# Patient Record
Sex: Female | Born: 2000 | Race: White | Hispanic: No | Marital: Single | Smoking: Never smoker
Health system: Southern US, Community
[De-identification: ages and names within clinical notes are randomized; demographics above are authoritative.]

## PROBLEM LIST (undated history)

## (undated) DIAGNOSIS — J45909 Unspecified asthma, uncomplicated: Secondary | ICD-10-CM

---
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
## 2018-06-27 ENCOUNTER — Ambulatory Visit (HOSPITAL_COMMUNITY)
Admission: EM | Admit: 2018-06-27 | Discharge: 2018-06-27 | Disposition: A | Discharge status: Home / Self Care | Payer: Self-pay | Attending: Family Medicine | Admitting: Family Medicine

## 2018-06-27 ENCOUNTER — Encounter (HOSPITAL_COMMUNITY): Payer: Self-pay | Admitting: Emergency Medicine

## 2018-06-27 DIAGNOSIS — J029 Acute pharyngitis, unspecified: Secondary | ICD-10-CM | POA: Insufficient documentation

## 2018-06-27 DIAGNOSIS — J4520 Mild intermittent asthma, uncomplicated: Secondary | ICD-10-CM | POA: Insufficient documentation

## 2018-06-27 DIAGNOSIS — Z113 Encounter for screening for infections with a predominantly sexual mode of transmission: Secondary | ICD-10-CM | POA: Insufficient documentation

## 2018-06-27 LAB — POCT RAPID STREP A: Streptococcus, Group A Screen (Direct): NEGATIVE

## 2018-06-27 MED ORDER — DOXYCYCLINE HYCLATE 100 MG PO CAPS: 100.0000 mg | ORAL_CAPSULE | Freq: Two times a day (BID) | ORAL | 0 refills | Status: DC

## 2018-06-27 MED ORDER — ALBUTEROL SULFATE HFA 108 (90 BASE) MCG/ACT IN AERS
2.0000 | INHALATION_SPRAY | Freq: Once | RESPIRATORY_TRACT | Status: AC
  Administered 2018-06-27: 2 via RESPIRATORY_TRACT

## 2018-06-27 MED ORDER — CETIRIZINE-PSEUDOEPHEDRINE ER 5-120 MG PO TB12: 1.0000 | ORAL_TABLET | Freq: Every day | ORAL | 0 refills | Status: DC

## 2018-06-27 MED ORDER — ALBUTEROL SULFATE HFA 108 (90 BASE) MCG/ACT IN AERS
INHALATION_SPRAY | RESPIRATORY_TRACT | Status: AC
  Filled 2018-06-27: qty 6.7

## 2018-06-27 MED ORDER — FLUTICASONE PROPIONATE 50 MCG/ACT NA SUSP: 2.0000 | Freq: Every day | NASAL | 0 refills | Status: DC

## 2018-06-27 MED ORDER — ALBUTEROL SULFATE (2.5 MG/3ML) 0.083% IN NEBU: 2.5000 mg | INHALATION_SOLUTION | Freq: Four times a day (QID) | RESPIRATORY_TRACT | 1 refills | Status: DC | PRN

## 2018-06-27 MED ORDER — CEFTRIAXONE SODIUM 250 MG IJ SOLR
250.0000 mg | Freq: Once | INTRAMUSCULAR | Status: AC
  Administered 2018-06-27: 250 mg via INTRAMUSCULAR

## 2018-06-27 MED ORDER — CEFTRIAXONE SODIUM 250 MG IJ SOLR
INTRAMUSCULAR | Status: AC
  Filled 2018-06-27: qty 250

## 2018-06-27 NOTE — ED Provider Notes (Signed)
[facility]   [id number] 06/27/18 Arrival Time: 1135   CC: URI symptoms and STD check  SUBJECTIVE: History from: patient.  Joann Dickerson is a 18 y.o. female who presents with sore throat and left ear pain x 2 days.  Denies known sick exposure or precipitating event, but works in a daycare.  Has tried OTC medications without relief.  Symptoms are made worse with swallowing, but tolerating liquids and secretions without difficulty.  Reports previous symptoms in the past and diagnosed with strep.   Complains of low grade fever.  Denies chills, fatigue, sinus pain, rhinorrhea, SOB, wheezing, chest pain, nausea, changes in bowel or bladder habits.    Pt also request STI testing today.  Currently asymptomatic.  Last unprotected sex 4-5 days ago.  Denies previous hx.  Denies abdominal or pelvic pain, rashes, lesions or bumps, vaginal discharge, vaginal itching, vaginal bleeding, dyspareunia.    Also request inhaler as well as nebulizer treatment for asthma.    ROS: As per HPI.  Past Medical History:  Diagnosis Date  . Asthma    History reviewed. No pertinent surgical history. Allergies  Allergen Reactions  . Zithromax [Azithromycin]     Rash, facial swelling   No current facility-administered medications on file prior to encounter.    No current outpatient medications on file prior to encounter.   Social History   Socioeconomic History  . Marital status: Single    Spouse name: Not on file  . Number of children: Not on file  . Years of education: Not on file  . Highest education level: Not on file  Occupational History  . Not on file  Social Needs  . Financial resource strain: Not on file  . Food insecurity:    Worry: Not on file    Inability: Not on file  . Transportation needs:    Medical: Not on file    Non-medical: Not on file  Tobacco Use  . Smoking status: Never Smoker  Substance and Sexual Activity  . Alcohol use: No    Frequency: Never  . Drug  use: Not on file  . Sexual activity: Not on file  Lifestyle  . Physical activity:    Days per week: Not on file    Minutes per session: Not on file  . Stress: Not on file  Relationships  . Social connections:    Talks on phone: Not on file    Gets together: Not on file    Attends religious service: Not on file    Active member of club or organization: Not on file    Attends meetings of clubs or organizations: Not on file    Relationship status: Not on file  . Intimate partner violence:    Fear of current or ex partner: Not on file    Emotionally abused: Not on file    Physically abused: Not on file    Forced sexual activity: Not on file  Other Topics Concern  . Not on file  Social History Narrative  . Not on file   No family history on file.  OBJECTIVE:  Vitals:   06/27/18 1303  BP: 133/70  Pulse: 77  Resp: 18  Temp: 99.1 F (37.3 C)  SpO2: 100%     General appearance: alert; appears milldy fatigued, but nontoxic; speaking in full sentences and tolerating own secretions HEENT: NCAT; Ears: EACs clear, TMs pearly gray; Eyes: PERRL.  EOM grossly intact. Nose: nares patent with rhinorrhea, Throat: oropharynx clear, tonsils  mildly erythematous not enlarged, uvula midline  Neck: supple without LAD Lungs: unlabored respirations, symmetrical air entry; cough: absent; no respiratory distress; CTAB Heart: regular rate and rhythm.  Radial pulses 2+ symmetrical bilaterally Abdomen: soft, nondistended, normal active bowel sounds; nontender to palpation; no guarding  GU: deferred Skin: warm and dry Psychological: alert and cooperative; normal mood and affect  LABS:  Results for orders placed or performed during the hospital encounter of 06/27/18 (from the past 24 hour(s))  POCT rapid strep A [facility])     Status: None   Collection Time: 06/27/18  1:34 PM  Result Value Ref Range   Streptococcus, Group A Screen (Direct) NEGATIVE NEGATIVE     ASSESSMENT & PLAN:  1.  Sore throat   2. Screen for sexually transmitted diseases   3. Mild intermittent asthma without complication     Meds ordered this encounter  Medications  . cefTRIAXone (ROCEPHIN) injection 250 mg    Order Specific Question:   Antibiotic Indication:    Answer:   STD  . doxycycline (VIBRAMYCIN) 100 MG capsule    Sig: Take 1 capsule (100 mg total) by mouth 2 (two) times daily.    Dispense:  20 capsule    Refill:  0    Order Specific Question:   Supervising Provider    Answer:   Eustace Moore [[id number]]  . albuterol (PROVENTIL HFA;VENTOLIN HFA) 108 (90 Base) MCG/ACT inhaler 2 puff  . albuterol (PROVENTIL) (2.5 MG/3ML) 0.083% nebulizer solution    Sig: Take 3 mLs (2.5 mg total) by nebulization every 6 (six) hours as needed for wheezing or shortness of breath.    Dispense:  75 mL    Refill:  1    Order Specific Question:   Supervising Provider    Answer:   Eustace Moore [[id number]]  . cetirizine-pseudoephedrine (ZYRTEC-D) 5-120 MG tablet    Sig: Take 1 tablet by mouth daily.    Dispense:  30 tablet    Refill:  0    Order Specific Question:   Supervising Provider    Answer:   Eustace Moore [[id number]]  . fluticasone (FLONASE) 50 MCG/ACT nasal spray    Sig: Place 2 sprays into both nostrils daily.    Dispense:  16 g    Refill:  0    Order Specific Question:   Supervising Provider    Answer:   Eustace Moore [[id number]]    Strep negative We will send out to culture and notify you of abnormal results. Symptoms most likely viral.  Zyrtec D and flonase prescribed.  Use daily for symptomatic relief Use OTC ibuprofen and/or tylenol as needed for pain and/ or fever  Vaginal swab and blood work obtained.   Ceftriaxone injection given in office.  Doxycycline, vs. azithromycin prescribed due to azithromycin allergy.  Take medications as directed and to completion We will follow up with you regarding abnormal results.   Please refrain from sexual activity in the meantime.  If  results are positive, please notify partners and encourage them to get treated before resuming sexual activity  Inhaler given in office.  Use as needed for shortness of breath and/or wheezing Nebulizer solution refilled.  Use as directed.    Return or go to ER if you have any new or worsening symptoms fever, chills, nausea, vomiting, chest pain, cough, shortness of breath, wheezing, abdominal pain, changes in bowel or bladder habits, etc...  Reviewed expectations re: course of current medical issues. Questions answered. Outlined  signs and symptoms indicating need for more acute intervention. Patient verbalized understanding. After Visit Summary given.         Rennis HardingWurst, Aleigha Gilani, PA-C 06/27/18 1417

## 2018-06-27 NOTE — Discharge Instructions (Addendum)
Strep negative We will send out to culture and notify you of abnormal results. Symptoms most likely viral.  Zyrtec D and flonase prescribed.  Use daily for symptomatic relief Use OTC ibuprofen and/or tylenol as needed for pain and/ or fever  Vaginal swab and blood work obtained.   Ceftriaxone injection given in office.  Doxycycline, vs. azithromycin prescribed due to azithromycin allergy.  Take medications as directed and to completion We will follow up with you regarding abnormal results.   Please refrain from sexual activity in the meantime.  If results are positive, please notify partners and encourage them to get treated before resuming sexual activity  Inhaler given in office.  Use as needed for shortness of breath and/or wheezing Nebulizer solution refilled.  Use as directed.    Follow up with PCP or [facility] if symptoms persists Return or go to ER if you have any new or worsening symptoms fever, chills, nausea, vomiting, chest pain, cough, shortness of breath, wheezing, abdominal pain, changes in bowel or bladder habits, etc..Marland Kitchen

## 2018-06-27 NOTE — ED Triage Notes (Signed)
Pt c/o sore throat and L ear pain x2 days, would like std check as well no symptoms.

## 2018-06-28 LAB — CERVICOVAGINAL ANCILLARY ONLY
Bacterial vaginitis: POSITIVE — AB
Candida vaginitis: NEGATIVE
Chlamydia: NEGATIVE
Neisseria Gonorrhea: NEGATIVE
TRICH (WINDOWPATH): NEGATIVE

## 2018-06-29 ENCOUNTER — Telehealth (HOSPITAL_COMMUNITY): Payer: Self-pay | Admitting: Emergency Medicine

## 2018-06-29 MED ORDER — METRONIDAZOLE 500 MG PO TABS: 500.0000 mg | ORAL_TABLET | Freq: Two times a day (BID) | ORAL | 0 refills | Status: AC

## 2018-06-29 NOTE — Telephone Encounter (Signed)
NEGATIVE STDs. STOP THE DOXYCYCLINE.  Bacterial vaginosis is positive. This was not treated at the urgent care visit.  Flagyl 500 mg BID x 7 days #14 no refills sent to patients pharmacy of choice.    Attempted to reach patient. No answer at this time. No voicemail.

## 2018-06-30 LAB — CULTURE, GROUP A STREP (THRC)

## 2018-07-02 ENCOUNTER — Telehealth (HOSPITAL_COMMUNITY): Payer: Self-pay | Admitting: Emergency Medicine

## 2018-07-02 NOTE — Telephone Encounter (Signed)
Attempted call x 2 no answer and no voicemail available

## 2018-07-05 ENCOUNTER — Telehealth (HOSPITAL_COMMUNITY): Payer: Self-pay | Admitting: Emergency Medicine

## 2018-07-05 NOTE — Telephone Encounter (Signed)
Attempted to reach patient x3. Someone answered and hung up immediately.

## 2018-07-25 ENCOUNTER — Encounter (HOSPITAL_COMMUNITY): Payer: Self-pay | Admitting: Emergency Medicine

## 2018-07-25 ENCOUNTER — Other Ambulatory Visit: Payer: Self-pay

## 2018-07-25 ENCOUNTER — Ambulatory Visit (HOSPITAL_COMMUNITY)
Admission: EM | Admit: 2018-07-25 | Discharge: 2018-07-25 | Disposition: A | Discharge status: Home / Self Care | Payer: Self-pay | Attending: Family Medicine | Admitting: Family Medicine

## 2018-07-25 DIAGNOSIS — J029 Acute pharyngitis, unspecified: Secondary | ICD-10-CM | POA: Insufficient documentation

## 2018-07-25 DIAGNOSIS — J4541 Moderate persistent asthma with (acute) exacerbation: Secondary | ICD-10-CM | POA: Insufficient documentation

## 2018-07-25 LAB — POCT RAPID STREP A: Streptococcus, Group A Screen (Direct): NEGATIVE

## 2018-07-25 MED ORDER — PREDNISONE 10 MG (21) PO TBPK: ORAL_TABLET | Freq: Every day | ORAL | 0 refills | Status: DC

## 2018-07-25 MED ORDER — ALBUTEROL SULFATE (2.5 MG/3ML) 0.083% IN NEBU: 2.5000 mg | INHALATION_SOLUTION | Freq: Four times a day (QID) | RESPIRATORY_TRACT | 1 refills | Status: DC | PRN

## 2018-07-25 NOTE — ED Triage Notes (Signed)
Pt c/o asthma flair up, tried her neb and inhaler without relief, pt states she had a fever today as well. Temp 101.8. took ibuprofen.

## 2018-07-25 NOTE — ED Provider Notes (Signed)
[facility]   [id number] 07/25/18 Arrival Time: 1326  ASSESSMENT & PLAN:  1. Moderate persistent asthma with exacerbation   2. Sore throat    Labs Reviewed  CULTURE, GROUP A STREP [facility])  POCT RAPID STREP A   Rapid strep negative. Culture sent. See AVS for d/c instructions.  Meds ordered this encounter  Medications  . albuterol (PROVENTIL) (2.5 MG/3ML) 0.083% nebulizer solution    Sig: Take 3 mLs (2.5 mg total) by nebulization every 6 (six) hours as needed for wheezing or shortness of breath.    Dispense:  75 mL    Refill:  1  . predniSONE (STERAPRED UNI-PAK 21 TAB) 10 MG (21) TBPK tablet    Sig: Take by mouth daily. Take as directed.    Dispense:  21 tablet    Refill:  0   Asthma precautions given. OTC symptom care as needed.  Reviewed expectations re: course of current medical issues. Questions answered. Outlined signs and symptoms indicating need for more acute intervention. Patient verbalized understanding. After Visit Summary given.  SUBJECTIVE: History from: patient.  Joann Dickerson is a 18 y.o. female who presents with complaint of persistent chest tightness and wheezing. Triggers: seasonal allergies. Onset gradually, over the past week. Describes wheezing as moderate when present. With a cough. Both cough and wheezing worse at night. Albuterol neb and inhaler with temporary relief only. Mild headache over the past few days. Fever: yes, reported at 101.8 degrees F at home today. Advil helped. Overall normal PO intake without n/v. Sick contacts: no. Typically her asthma is well controlled. OTC treatment: none reported. No current chest pain.  Social History   Tobacco Use  Smoking Status Never Smoker   ROS: As per HPI. All other systems negative.    OBJECTIVE:  Vitals:   07/25/18 1339 07/25/18 1342  BP: 98/60   Pulse: 88   Resp: 16   Temp: 98.4 F (36.9 C)   SpO2:  100%    General appearance: alert; appears fatigued HEENT: nasal  congestion; clear runny nose; throat irritation and moderate erythema; slightly enlarged tonsils bilaterally Neck: supple without LAD Cv: RRR without murmer Lungs: unlabored respirations, mild bilateral expiratory wheezing; cough: mild; no significant respiratory distress Skin: warm and dry Psychological: alert and cooperative; normal mood and affect   Allergies  Allergen Reactions  . Zithromax [Azithromycin]     Rash, facial swelling    Past Medical History:  Diagnosis Date  . Asthma     Social History   Socioeconomic History  . Marital status: Single    Spouse name: Not on file  . Number of children: Not on file  . Years of education: Not on file  . Highest education level: Not on file  Occupational History  . Not on file  Social Needs  . Financial resource strain: Not on file  . Food insecurity:    Worry: Not on file    Inability: Not on file  . Transportation needs:    Medical: Not on file    Non-medical: Not on file  Tobacco Use  . Smoking status: Never Smoker  Substance and Sexual Activity  . Alcohol use: No    Frequency: Never  . Drug use: Not on file  . Sexual activity: Not on file  Lifestyle  . Physical activity:    Days per week: Not on file    Minutes per session: Not on file  . Stress: Not on file  Relationships  . Social connections:  Talks on phone: Not on file    Gets together: Not on file    Attends religious service: Not on file    Active member of club or organization: Not on file    Attends meetings of clubs or organizations: Not on file    Relationship status: Not on file  . Intimate partner violence:    Fear of current or ex partner: Not on file    Emotionally abused: Not on file    Physically abused: Not on file    Forced sexual activity: Not on file  Other Topics Concern  . Not on file  Social History Narrative  . Not on file            Mardella Layman, MD 07/26/18 [phone]

## 2018-07-25 NOTE — Discharge Instructions (Addendum)
You may use over the counter ibuprofen or acetaminophen as needed.  For a sore throat, over the counter products such as [employer] Peroxyl Mouth Sore Rinse or Chloraseptic Sore Throat Spray may provide some temporary relief. Your rapid strep test was negative today. We have sent your throat swab for culture and will let you know of any positive results. 

## 2018-07-28 LAB — CULTURE, GROUP A STREP (THRC)

## 2018-10-14 ENCOUNTER — Encounter (HOSPITAL_COMMUNITY): Payer: Self-pay

## 2018-10-14 ENCOUNTER — Other Ambulatory Visit: Payer: Self-pay

## 2018-10-14 ENCOUNTER — Ambulatory Visit (HOSPITAL_COMMUNITY)
Admission: EM | Admit: 2018-10-14 | Discharge: 2018-10-14 | Disposition: A | Discharge status: Home / Self Care | Payer: Self-pay | Attending: Family Medicine | Admitting: Family Medicine

## 2018-10-14 DIAGNOSIS — R05 Cough: Secondary | ICD-10-CM

## 2018-10-14 DIAGNOSIS — J0100 Acute maxillary sinusitis, unspecified: Secondary | ICD-10-CM

## 2018-10-14 DIAGNOSIS — R059 Cough, unspecified: Secondary | ICD-10-CM

## 2018-10-14 MED ORDER — AMOXICILLIN-POT CLAVULANATE 875-125 MG PO TABS: 1.0000 | ORAL_TABLET | Freq: Two times a day (BID) | ORAL | 0 refills | Status: AC

## 2018-10-14 MED ORDER — FLUTICASONE PROPIONATE 50 MCG/ACT NA SUSP: 2.0000 | Freq: Every day | NASAL | 0 refills | Status: AC

## 2018-10-14 MED ORDER — CETIRIZINE-PSEUDOEPHEDRINE ER 5-120 MG PO TB12: 1.0000 | ORAL_TABLET | Freq: Every day | ORAL | 0 refills | Status: AC

## 2018-10-14 MED ORDER — BENZONATATE 100 MG PO CAPS: 100.0000 mg | ORAL_CAPSULE | Freq: Three times a day (TID) | ORAL | 0 refills | Status: AC

## 2018-10-14 MED ORDER — ALBUTEROL SULFATE (2.5 MG/3ML) 0.083% IN NEBU: 2.5000 mg | INHALATION_SOLUTION | Freq: Four times a day (QID) | RESPIRATORY_TRACT | 1 refills | Status: DC | PRN

## 2018-10-14 NOTE — ED Provider Notes (Signed)
[facility]    CSN: [id number] Arrival date & time: 10/14/18  1100     History   Chief Complaint Chief Complaint  Patient presents with  . Cough    HPI Joann Dickerson is a 18 y.o. female.   Patient is a 18 year old female the presents today with approximately 8 days of cough, congestion, sinus congestion, mucus, fever and sinus pressure.  Symptoms have worsened.  Reporting fever as high as 101.  She has been taking over-the-counter medication for relief of symptoms.  Patient does work in a daycare.  She was tested for COVID on Thursday which revealed negative results.  She also has asthma and has been using her albuterol nebulizer machine due to mild shortness of breath.  This seems to help.  No known sick contacts that she is aware of.  No chest pain, shortness of breath.  ROS per HPI      Past Medical History:  Diagnosis Date  . Asthma     There are no active problems to display for this patient.   History reviewed. No pertinent surgical history.  OB History   No obstetric history on file.      Home Medications    Prior to Admission medications   Medication Sig Start Date End Date Taking? Authorizing Provider  albuterol (PROVENTIL) (2.5 MG/3ML) 0.083% nebulizer solution Take 3 mLs (2.5 mg total) by nebulization every 6 (six) hours as needed for wheezing or shortness of breath. 10/14/18   Dahlia ByesBast, Devlon Dosher A, NP  amoxicillin-clavulanate (AUGMENTIN) 875-125 MG tablet Take 1 tablet by mouth every 12 (twelve) hours. 10/14/18   Deriyah Kunath, Gloris Manchesterraci A, NP  benzonatate (TESSALON) 100 MG capsule Take 1 capsule (100 mg total) by mouth every 8 (eight) hours. 10/14/18   Aveion Nguyen, Gloris Manchesterraci A, NP  cetirizine-pseudoephedrine (ZYRTEC-D) 5-120 MG tablet Take 1 tablet by mouth daily. 10/14/18   Roniyah Llorens, Gloris Manchesterraci A, NP  fluticasone (FLONASE) 50 MCG/ACT nasal spray Place 2 sprays into both nostrils daily. 10/14/18   Janace ArisBast, Tora Prunty A, NP    Family History History reviewed. No pertinent family history.   Social History Social History   Tobacco Use  . Smoking status: Never Smoker  . Smokeless tobacco: Never Used  Substance Use Topics  . Alcohol use: No    Frequency: Never  . Drug use: Not on file     Allergies   Zithromax [azithromycin]   Review of Systems Review of Systems   Physical Exam Triage Vital Signs ED Triage Vitals  Enc Vitals Group     BP 10/14/18 1115 108/65     Pulse Rate 10/14/18 1115 92     Resp 10/14/18 1115 18     Temp 10/14/18 1115 98.7 F (37.1 C)     Temp Source 10/14/18 1115 Oral     SpO2 10/14/18 1115 100 %     Weight 10/14/18 1113 170 lb (77.1 kg)     Height --      Head Circumference --      Peak Flow --      Pain Score 10/14/18 1113 7     Pain Loc --      Pain Edu? --      Excl. in GC? --    No data found.  Updated Vital Signs BP 108/65 (BP Location: Right Arm)   Pulse 92   Temp 98.7 F (37.1 C) (Oral)   Resp 18   Wt 170 lb (77.1 kg)   LMP 10/14/2018   SpO2  100%   Visual Acuity Right Eye Distance:   Left Eye Distance:   Bilateral Distance:    Right Eye Near:   Left Eye Near:    Bilateral Near:     Physical Exam Vitals signs and nursing note reviewed.  Constitutional:      General: She is not in acute distress.    Appearance: Normal appearance. She is not ill-appearing, toxic-appearing or diaphoretic.  HENT:     Head: Normocephalic and atraumatic.     Right Ear: Tympanic membrane and ear canal normal.     Left Ear: Hearing normal. A middle ear effusion is present.     Nose: Congestion and rhinorrhea present.     Right Turbinates: Not swollen.     Left Turbinates: Not swollen.     Right Sinus: Maxillary sinus tenderness present.     Left Sinus: Maxillary sinus tenderness present.     Mouth/Throat:     Lips: Pink.     Mouth: Mucous membranes are moist.     Pharynx: Posterior oropharyngeal erythema present.     Tonsils: 2+ on the right. 2+ on the left.  Neck:     Musculoskeletal: Normal range of motion.   Cardiovascular:     Rate and Rhythm: Normal rate and regular rhythm.     Pulses: Normal pulses.  Pulmonary:     Effort: Pulmonary effort is normal.     Breath sounds: No stridor, decreased air movement or transmitted upper airway sounds. Examination of the left-middle field reveals decreased breath sounds. Examination of the left-lower field reveals decreased breath sounds. Decreased breath sounds present. No wheezing or rhonchi.  Musculoskeletal: Normal range of motion.  Lymphadenopathy:     Cervical: No cervical adenopathy.  Skin:    General: Skin is warm and dry.  Neurological:     Mental Status: She is alert.  Psychiatric:        Mood and Affect: Mood normal.      UC Treatments / Results  Labs (all labs ordered are listed, but only abnormal results are displayed) Labs Reviewed - No data to display  EKG None  Radiology No results found.  Procedures Procedures (including critical care time)  Medications Ordered in UC Medications - No data to display  Initial Impression / Assessment and Plan / UC Course  I have reviewed the triage vital signs and the nursing notes.  Pertinent labs & imaging results that were available during my care of the patient were reviewed by me and considered in my medical decision making (see chart for details).      Treating for sinus infection and possible lower respiratory tract infection. Patient having symptoms for over a week with worsening symptoms. She does work in a daycare. Recently tested for COVID which was negative We will go ahead and treat with Augmentin for antibiotic coverage. Tessalon Perles as needed for cough Zyrtec and Flonase for nasal congestion and allergy symptoms Refilled albuterol nebulizer solution to use as needed for cough, wheezing or shortness of breath. Follow up as needed for continued or worsening symptoms  Final Clinical Impressions(s) / UC Diagnoses   Final diagnoses:  Acute non-recurrent maxillary  sinusitis  Cough     Discharge Instructions     Treating you for a sinus infection and possible lower respiratory tract infection. Take the Augmentin for antibiotic coverage Tessalon Perles as needed for cough Zyrtec-D for nasal congestion and allergy type symptoms. Flonase nasal spray for nasal congestion Refilled your albuterol nebulizer solution  Follow up as needed for continued or worsening symptoms     ED Prescriptions    Medication Sig Dispense Auth. Provider   fluticasone (FLONASE) 50 MCG/ACT nasal spray Place 2 sprays into both nostrils daily. 16 g Shantese Raven A, NP   amoxicillin-clavulanate (AUGMENTIN) 875-125 MG tablet Take 1 tablet by mouth every 12 (twelve) hours. 14 tablet Deserea Bordley A, NP   cetirizine-pseudoephedrine (ZYRTEC-D) 5-120 MG tablet Take 1 tablet by mouth daily. 30 tablet Carolos Fecher A, NP   benzonatate (TESSALON) 100 MG capsule Take 1 capsule (100 mg total) by mouth every 8 (eight) hours. 21 capsule Joncarlo Friberg A, NP   albuterol (PROVENTIL) (2.5 MG/3ML) 0.083% nebulizer solution Take 3 mLs (2.5 mg total) by nebulization every 6 (six) hours as needed for wheezing or shortness of breath. 75 mL Loura Halt A, NP     Controlled Substance Prescriptions [place] Controlled Substance Registry consulted? Not Applicable   Orvan July, NP 10/14/18 1201

## 2018-10-14 NOTE — ED Triage Notes (Signed)
Pt cc cough , congestion, fever, and sore throat. This has been going on for a week.

## 2018-10-14 NOTE — Discharge Instructions (Addendum)
Treating you for a sinus infection and possible lower respiratory tract infection. Take the Augmentin for antibiotic coverage Tessalon Perles as needed for cough Zyrtec-D for nasal congestion and allergy type symptoms. Flonase nasal spray for nasal congestion Refilled your albuterol nebulizer solution Follow up as needed for continued or worsening symptoms

## 2018-10-27 ENCOUNTER — Encounter (HOSPITAL_BASED_OUTPATIENT_CLINIC_OR_DEPARTMENT_OTHER): Payer: Self-pay

## 2018-10-27 ENCOUNTER — Emergency Department
Admission: EM | Admit: 2018-10-27 | Discharge: 2018-10-27 | Payer: Self-pay | Source: Home / Self Care | Attending: Family Medicine | Admitting: Family Medicine

## 2018-10-27 ENCOUNTER — Other Ambulatory Visit: Payer: Self-pay

## 2018-10-27 ENCOUNTER — Emergency Department (HOSPITAL_BASED_OUTPATIENT_CLINIC_OR_DEPARTMENT_OTHER)
Admission: EM | Admit: 2018-10-27 | Discharge: 2018-10-27 | Disposition: A | Discharge status: Home / Self Care | Payer: Medicaid Other | Attending: Emergency Medicine | Admitting: Emergency Medicine

## 2018-10-27 ENCOUNTER — Emergency Department (HOSPITAL_BASED_OUTPATIENT_CLINIC_OR_DEPARTMENT_OTHER): Payer: Medicaid Other

## 2018-10-27 DIAGNOSIS — R1031 Right lower quadrant pain: Secondary | ICD-10-CM

## 2018-10-27 DIAGNOSIS — R1011 Right upper quadrant pain: Secondary | ICD-10-CM | POA: Insufficient documentation

## 2018-10-27 DIAGNOSIS — R112 Nausea with vomiting, unspecified: Secondary | ICD-10-CM

## 2018-10-27 DIAGNOSIS — J45909 Unspecified asthma, uncomplicated: Secondary | ICD-10-CM | POA: Insufficient documentation

## 2018-10-27 DIAGNOSIS — R1013 Epigastric pain: Secondary | ICD-10-CM

## 2018-10-27 DIAGNOSIS — Z79899 Other long term (current) drug therapy: Secondary | ICD-10-CM | POA: Insufficient documentation

## 2018-10-27 LAB — POCT URINALYSIS DIP (MANUAL ENTRY)
Bilirubin, UA: NEGATIVE
Glucose, UA: NEGATIVE mg/dL
Ketones, POC UA: NEGATIVE mg/dL
Leukocytes, UA: NEGATIVE
Nitrite, UA: NEGATIVE
Spec Grav, UA: >=1.03 — AB (ref 1.010–1.025)
Urobilinogen, UA: 0.2 E.U./dL
pH, UA: 5.5 (ref 5.0–8.0)

## 2018-10-27 LAB — COMPREHENSIVE METABOLIC PANEL
ALT: 16 U/L (ref 0–44)
AST: 17 U/L (ref 15–41)
Albumin: 4.1 g/dL (ref 3.5–5.0)
Alkaline Phosphatase: 51 U/L (ref 38–126)
Anion gap: 7 (ref 5–15)
BUN: 14 mg/dL (ref 6–20)
CO2: 21 mmol/L — ABNORMAL LOW (ref 22–32)
Calcium: 9.1 mg/dL (ref 8.9–10.3)
Chloride: 111 mmol/L (ref 98–111)
Creatinine, Ser: 0.76 mg/dL (ref 0.44–1.00)
GFR calc Af Amer: >60 mL/min (ref >60)
GFR calc non Af Amer: >60 mL/min (ref >60)
Glucose, Bld: 100 mg/dL — ABNORMAL HIGH (ref 70–99)
Potassium: 3.7 mmol/L (ref 3.5–5.1)
Sodium: 139 mmol/L (ref 135–145)
Total Bilirubin: 0.8 mg/dL (ref 0.3–1.2)
Total Protein: 7.5 g/dL (ref 6.5–8.1)

## 2018-10-27 LAB — CBC WITH DIFFERENTIAL/PLATELET
Abs Immature Granulocytes: 0.01 10*3/uL (ref 0.00–0.07)
Basophils Absolute: 0 10*3/uL (ref 0.0–0.1)
Basophils Relative: 0 %
Eosinophils Absolute: 0.3 10*3/uL (ref 0.0–0.5)
Eosinophils Relative: 4 %
HCT: 37.2 % (ref 36.0–46.0)
Hemoglobin: 12.2 g/dL (ref 12.0–15.0)
Immature Granulocytes: 0 %
Lymphocytes Relative: 30 %
Lymphs Abs: 2.1 10*3/uL (ref 0.7–4.0)
MCH: 29.4 pg (ref 26.0–34.0)
MCHC: 32.8 g/dL (ref 30.0–36.0)
MCV: 89.6 fL (ref 80.0–100.0)
Monocytes Absolute: 0.6 10*3/uL (ref 0.1–1.0)
Monocytes Relative: 8 %
Neutro Abs: 4.1 10*3/uL (ref 1.7–7.7)
Neutrophils Relative %: 58 %
Platelets: 272 10*3/uL (ref 150–400)
RBC: 4.15 MIL/uL (ref 3.87–5.11)
RDW: 12.6 % (ref 11.5–15.5)
WBC: 7.1 10*3/uL (ref 4.0–10.5)
nRBC: 0 % (ref 0.0–0.2)

## 2018-10-27 LAB — LIPASE, BLOOD: Lipase: 25 U/L (ref 11–51)

## 2018-10-27 LAB — WET PREP, GENITAL
Clue Cells Wet Prep HPF POC: NONE SEEN
Sperm: NONE SEEN
Trich, Wet Prep: NONE SEEN
Yeast Wet Prep HPF POC: NONE SEEN

## 2018-10-27 LAB — POCT URINE PREGNANCY: Preg Test, Ur: NEGATIVE

## 2018-10-27 MED ORDER — IOHEXOL 300 MG/ML  SOLN
100.0000 mL | Freq: Once | INTRAMUSCULAR | Status: AC | PRN
  Administered 2018-10-27: 100 mL via INTRAVENOUS

## 2018-10-27 MED ORDER — SUCRALFATE 1 G PO TABS: 1.0000 g | ORAL_TABLET | Freq: Three times a day (TID) | ORAL | 0 refills | Status: AC

## 2018-10-27 MED ORDER — ONDANSETRON 4 MG PO TBDP
4.0000 mg | ORAL_TABLET | Freq: Once | ORAL | Status: AC
  Administered 2018-10-27: 4 mg via ORAL

## 2018-10-27 MED ORDER — ALUM & MAG HYDROXIDE-SIMETH 200-200-20 MG/5ML PO SUSP
15.0000 mL | Freq: Once | ORAL | Status: AC
  Administered 2018-10-27: 15 mL via ORAL
  Filled 2018-10-27: qty 30

## 2018-10-27 MED ORDER — FAMOTIDINE 20 MG PO TABS: 20.0000 mg | ORAL_TABLET | Freq: Two times a day (BID) | ORAL | 0 refills | Status: AC

## 2018-10-27 MED ORDER — ACETAMINOPHEN 325 MG PO TABS
650.0000 mg | ORAL_TABLET | Freq: Once | ORAL | Status: AC
  Administered 2018-10-27: 650 mg via ORAL
  Filled 2018-10-27: qty 2

## 2018-10-27 MED ORDER — ONDANSETRON 4 MG PO TBDP: 4.0000 mg | ORAL_TABLET | Freq: Three times a day (TID) | ORAL | 0 refills | Status: AC | PRN

## 2018-10-27 MED ORDER — OMEPRAZOLE 20 MG PO CPDR: 20.0000 mg | DELAYED_RELEASE_CAPSULE | Freq: Every day | ORAL | 0 refills | Status: AC

## 2018-10-27 NOTE — ED Provider Notes (Signed)
[facility] EMERGENCY DEPARTMENT Provider Note   CSN: [id number] Arrival date & time: 10/27/18  1330     History   Chief Complaint Chief Complaint  Patient presents with   Abdominal Pain    HPI Joann Dickerson is a 18 y.o. female.     HPI  Patient is a 18 year old female with past medical history of asthma presenting for right-sided abdominal pain.  Patient reports that approximately 1 week she has had difficulty keeping food down and vomiting 1-2 times per day.  It is always in relation to eating.  And nonbilious and nonbloody.  Patient reports of Joann past 3 to 4 days she began feeling a "knot" in her epigastrium that then migrated to her right upper and right sided abdomen.  Patient reports that there is a low level of pain all Joann time but she has colicky intermittent sharpness particularly right after she eats.  She denies dysuria, urgency or frequency.  She reports she is having regular bowel movements without constipation or diarrhea.  She does report she has small amount of increased vaginal discharge and was diagnosed with BV a couple months ago but was never treated.  She was tested for STI a couple months ago, reports she is sexually active with one female partner and denies concern about STI.  No abdominal surgical history.  Patient has tried Tums for her symptoms without relief and has been taking ibuprofen.   Past Medical History:  Diagnosis Date   Asthma     There are no active problems to display for this patient.   History reviewed. No pertinent surgical history.   OB History   No obstetric history on file.      Home Medications    Prior to Admission medications   Medication Sig Start Date End Date Taking? Authorizing Provider  albuterol (PROVENTIL) (2.5 MG/3ML) 0.083% nebulizer solution Take 3 mLs (2.5 mg total) by nebulization every 6 (six) hours as needed for wheezing or shortness of breath. 10/14/18   Dahlia ByesBast, Traci A, NP    amoxicillin-clavulanate (AUGMENTIN) 875-125 MG tablet Take 1 tablet by mouth every 12 (twelve) hours. 10/14/18   Bast, Gloris Manchesterraci A, NP  benzonatate (TESSALON) 100 MG capsule Take 1 capsule (100 mg total) by mouth every 8 (eight) hours. 10/14/18   Bast, Gloris Manchesterraci A, NP  cetirizine-pseudoephedrine (ZYRTEC-D) 5-120 MG tablet Take 1 tablet by mouth daily. 10/14/18   Bast, Gloris Manchesterraci A, NP  fluticasone (FLONASE) 50 MCG/ACT nasal spray Place 2 sprays into both nostrils daily. 10/14/18   Janace ArisBast, Traci A, NP    Family History No family history on file.  Social History Social History   Tobacco Use   Smoking status: Never Smoker   Smokeless tobacco: Never Used  Substance Use Topics   Alcohol use: No    Frequency: Never   Drug use: Never     Allergies   Zithromax [azithromycin]   Review of Systems Review of Systems  Constitutional: Negative for chills and fever.  HENT: Negative for congestion and sore throat.   Respiratory: Negative for cough, chest tightness and shortness of breath.   Cardiovascular: Negative for chest pain, palpitations and leg swelling.  Gastrointestinal: Positive for abdominal pain, nausea and vomiting. Negative for constipation and diarrhea.  Genitourinary: Positive for vaginal bleeding (pt is menstruating) and vaginal discharge. Negative for dysuria and flank pain.  Musculoskeletal: Negative for back pain and myalgias.  Skin: Negative for rash.  Neurological: Negative for light-headedness.  All other systems reviewed  and are negative.    Physical Exam Updated Vital Signs BP 129/66 (BP Location: Left Arm)    Pulse 86    Temp 98.8 F (37.1 C) (Oral)    Resp 20    Ht 5\' 1"  (1.549 m)    Wt 77.1 kg    LMP 10/14/2018    SpO2 100%    BMI 32.12 kg/m   Physical Exam Vitals signs and nursing note reviewed.  Constitutional:      General: She is not in acute distress.    Appearance: She is well-developed.  HENT:     Head: Normocephalic and atraumatic.  Eyes:      Conjunctiva/sclera: Conjunctivae normal.     Pupils: Pupils are equal, round, and reactive to light.  Neck:     Musculoskeletal: Normal range of motion and neck supple.  Cardiovascular:     Rate and Rhythm: Normal rate and regular rhythm.     Heart sounds: S1 normal and S2 normal. No murmur.  Pulmonary:     Effort: Pulmonary effort is normal.     Breath sounds: Normal breath sounds. No wheezing or rales.  Abdominal:     General: There is no distension.     Palpations: Abdomen is soft.     Tenderness: There is abdominal tenderness in Joann right upper quadrant, right lower quadrant, epigastric area and periumbilical area. There is no guarding.     Comments: Pain primarily in right upper quadrant, but patient does report that pain refers to her right upper quadrant when she is palpated along Joann entire right side of her abdomen.  Genitourinary:    Comments: Pelvic examination performed with RN chaperone present.  No lesions of Joann inguinal region.  Vaginal tissue pink and rugated.  Cervix nonerythematous and nonfriable.  There is minimal vaginal discharge present. On bimanual exam, patient had no CMT, but does have discomfort to palpation of Joann right adnexa.  No left adnexal discomfort. Musculoskeletal: Normal range of motion.        General: No deformity.  Lymphadenopathy:     Cervical: No cervical adenopathy.  Skin:    General: Skin is warm and dry.     Findings: No erythema or rash.  Neurological:     Mental Status: She is alert.     Comments: Cranial nerves grossly intact. Patient moves extremities symmetrically and with good coordination.  Psychiatric:        Behavior: Behavior normal.        Thought Content: Thought content normal.        Judgment: Judgment normal.      ED Treatments / Results  Labs (all labs ordered are listed, but only abnormal results are displayed) Labs Reviewed  WET PREP, GENITAL - Abnormal; Notable for Joann following components:      Result Value    WBC, Wet Prep HPF POC MANY (*)    All other components within normal limits  COMPREHENSIVE METABOLIC PANEL - Abnormal; Notable for Joann following components:   CO2 21 (*)    Glucose, Bld 100 (*)    All other components within normal limits  CBC WITH DIFFERENTIAL/PLATELET  LIPASE, BLOOD    EKG    Radiology No results found.  Procedures Procedures (including critical care time)  Medications Ordered in ED Medications - No data to display   Initial Impression / Assessment and Plan / ED Course  I have reviewed Joann triage vital signs and Joann nursing notes.  Pertinent labs & imaging results  that were available during my care of Joann patient were reviewed by me and considered in my medical decision making (see chart for details).  Clinical Course as of Oct 26 1652  Sat Oct 27, 2018  1359 Patient states she thinks her BV has returned.  She would like to be tested for this and a wet prep but does not want to receive full STI testing as she recently had this.   [AM]  1651 Spoke with Dr. Jake SamplesFujinaga regarding Joann radiopaque punctate findings in Joann colon.  She states they do not look like metal foreign bodies.  I discussed this with Joann patient and she denies eating any metal objects or nonfood objects.   [AM]    Clinical Course User Index [AM] Elisha PonderMurray, Marl Seago B, PA-C       This is a well-appearing [age] female with past medical history of asthma presenting for colicky right-sided abdominal pain.  Differential diagnosis includes cholecystitis, cholelithiasis, appendicitis, peptic ulcer disease, gastritis.  She is nontoxic-appearing, afebrile, and in no acute distress on exam.  Given lack of ultrasound today, and with appendicitis on differential, will obtain CT scan.  Should patient require further clarification of Joann right upper quadrant findings on ultrasound, will transfer patient.  Incidentally, patient does report she is also had some increased vaginal discharge and believes that her  bacterial vaginosis has returned.  She is recently tested for STI and does not wish to be retested today but does wish to be tested for BV.  Work-up very reassuring.  No leukocytosis.  Normal renal function.  No transaminitis.  Lipase is normal.  CT abdomen and pelvis shows no acute findings specifically no appendicitis and no gallbladder wall thickening, cholelithiasis or CBD dilatation.  I did discuss with Joann patient if her pain persisted is more focal to Joann right upper quadrant she may need to be present for a right upper quadrant ultrasound.  Case discussed with Dr. Linwood DibblesJon Knapp, who states that this should be sufficient as an evaluation for patient's symptoms at this time.  Patient did have WBCs in her vaginal fluid however examination is not consistent with PID, and she reports that her pain is not located in Joann lower abdomen unless someone is pushing on it.  Given reassuring biliary structures on CT, and patient's pain after eating, will treat for gastritis versus peptic ulcer disease.  She is instructed to follow-up with primary care provider for recheck of symptoms and referral to gastroenterology as needed.  Return precautions given for any intractable nausea or vomiting, worsening pain or focal pain or fevers with pain.  Patient is in understanding and agrees with Joann plan of care.  Final Clinical Impressions(s) / ED Diagnoses   Final diagnoses:  Epigastric pain  RUQ pain    ED Discharge Orders         Ordered    omeprazole (PRILOSEC) 20 MG capsule  Daily     10/27/18 1703    sucralfate (CARAFATE) 1 g tablet  3 times daily with meals & bedtime     10/27/18 1703    famotidine (PEPCID) 20 MG tablet  2 times daily     10/27/18 1703    ondansetron (ZOFRAN ODT) 4 MG disintegrating tablet  Every 8 hours PRN     10/27/18 1703           Elisha PonderMurray, Dartanion Teo B, PA-C 10/27/18 1735    Linwood DibblesKnapp, Jon, MD 10/27/18 [phone]

## 2018-10-27 NOTE — ED Provider Notes (Signed)
[facility]    CSN: [id number] Arrival date & time: 10/27/18  1045     History   Chief Complaint Chief Complaint  Patient presents with  . Abdominal Pain  . Nausea  . Emesis    HPI Joann Dickerson is a 18 y.o. female.   One week ago patient began developing intermittent right lancinating abdominal pain that occurred immediately after eating, associated with nausea/vomiting.  The pain does not radiate and has now become constant but remains colicky.  She states that her bowel movements have been normal.  She denies urinary symptoms, pelvic pain or vaginal discharge.  Her last menstrual period ended two days ago (she started Depo Provera on 08/23/18). She denies fevers, chills, and sweats. She reports that she has lost about 10 pounds during the past week.  The history is provided by the patient.  Abdominal Pain Pain location:  RUQ and RLQ Pain quality: stabbing   Pain radiates to:  Does not radiate Pain severity:  Moderate Onset quality:  Sudden Duration:  1 week Timing:  Constant Progression:  Worsening Chronicity:  New Context: awakening from sleep and eating   Context: not alcohol use, not diet changes, not previous surgeries, not recent illness, not recent travel, not sick contacts, not suspicious food intake and not trauma   Relieved by:  Nothing Worsened by:  Eating Ineffective treatments: Tums. Associated symptoms: anorexia, fatigue, nausea and vomiting   Associated symptoms: no chest pain, no chills, no constipation, no cough, no diarrhea, no dysuria, no fever, no hematemesis, no hematochezia, no hematuria, no melena, no shortness of breath, no sore throat, no vaginal bleeding and no vaginal discharge   Emesis Associated symptoms: abdominal pain   Associated symptoms: no chills, no cough, no diarrhea, no fever and no sore throat     Past Medical History:  Diagnosis Date  . Asthma     There are no active problems to display for this patient.   History reviewed. No pertinent surgical history.  OB History   No obstetric history on file.      Home Medications    Prior to Admission medications   Medication Sig Start Date End Date Taking? Authorizing Provider  albuterol (PROVENTIL) (2.5 MG/3ML) 0.083% nebulizer solution Take 3 mLs (2.5 mg total) by nebulization every 6 (six) hours as needed for wheezing or shortness of breath. 10/14/18   Dahlia ByesBast, Traci A, NP  amoxicillin-clavulanate (AUGMENTIN) 875-125 MG tablet Take 1 tablet by mouth every 12 (twelve) hours. 10/14/18   Bast, Gloris Manchesterraci A, NP  benzonatate (TESSALON) 100 MG capsule Take 1 capsule (100 mg total) by mouth every 8 (eight) hours. 10/14/18   Bast, Gloris Manchesterraci A, NP  cetirizine-pseudoephedrine (ZYRTEC-D) 5-120 MG tablet Take 1 tablet by mouth daily. 10/14/18   Bast, Gloris Manchesterraci A, NP  fluticasone (FLONASE) 50 MCG/ACT nasal spray Place 2 sprays into both nostrils daily. 10/14/18   Janace ArisBast, Traci A, NP    Family History History reviewed. No pertinent family history.  Social History Social History   Tobacco Use  . Smoking status: Never Smoker  . Smokeless tobacco: Never Used  Substance Use Topics  . Alcohol use: No    Frequency: Never  . Drug use: Not on file     Allergies   Zithromax [azithromycin]   Review of Systems Review of Systems  Constitutional: Positive for fatigue. Negative for chills and fever.  HENT: Negative for sore throat.   Respiratory: Negative for cough and shortness of breath.   Cardiovascular:  Negative for chest pain.  Gastrointestinal: Positive for abdominal pain, anorexia, nausea and vomiting. Negative for constipation, diarrhea, hematemesis, hematochezia and melena.  Genitourinary: Negative for dysuria, hematuria, vaginal bleeding and vaginal discharge.  All other systems reviewed and are negative.    Physical Exam Triage Vital Signs ED Triage Vitals  Enc Vitals Group     BP 10/27/18 1153 120/79     Pulse Rate 10/27/18 1153 74     Resp 10/27/18 1153 18      Temp 10/27/18 1153 98.4 F (36.9 C)     Temp Source 10/27/18 1153 Oral     SpO2 10/27/18 1153 100 %     Weight 10/27/18 1154 170 lb (77.1 kg)     Height 10/27/18 1154 5\' 1"  (1.549 m)     Head Circumference --      Peak Flow --      Pain Score 10/27/18 1153 7     Pain Loc --      Pain Edu? --      Excl. in [place]? --    No data found.  Updated Vital Signs BP 120/79 (BP Location: Right Arm)   Pulse 74   Temp 98.4 F (36.9 C) (Oral)   Resp 18   Ht 5\' 1"  (1.549 m)   Wt 77.1 kg   LMP 10/14/2018   SpO2 100%   BMI 32.12 kg/m   Visual Acuity Right Eye Distance:   Left Eye Distance:   Bilateral Distance:    Right Eye Near:   Left Eye Near:    Bilateral Near:     Physical Exam Vitals signs and nursing note reviewed.  Constitutional:      General: She is not in acute distress. HENT:     Head: Normocephalic.     Mouth/Throat:     Mouth: Mucous membranes are moist.  Eyes:     Extraocular Movements: Extraocular movements intact.  Cardiovascular:     Rate and Rhythm: Normal rate.     Heart sounds: Normal heart sounds.  Pulmonary:     Breath sounds: Normal breath sounds.  Abdominal:     General: Bowel sounds are normal. There is no distension.     Palpations: Abdomen is soft. There is no hepatomegaly or splenomegaly.     Tenderness: There is abdominal tenderness in the right upper quadrant and right lower quadrant. There is rebound. There is no right CVA tenderness, left CVA tenderness or guarding. Positive signs include Murphy's sign, McBurney's sign, psoas sign and obturator sign.     Hernia: No hernia is present.    Musculoskeletal:     Right lower leg: No edema.     Left lower leg: No edema.  Lymphadenopathy:     Cervical: No cervical adenopathy.  Skin:    General: Skin is warm and dry.  Neurological:     Mental Status: She is alert.      UC Treatments / Results  Labs (all labs ordered are listed, but only abnormal results are displayed) Labs Reviewed   POCT CBC W AUTO DIFF ([place]) unable to obtain specimen  POCT URINALYSIS DIP (MANUAL ENTRY)  Trace BLO, no leuks  POCT URINE PREGNANCY negative    EKG None  Radiology No results found.  Procedures Procedures (including critical care time)  Medications Ordered in UC Medications  ondansetron (ZOFRAN-ODT) disintegrating tablet 4 mg (4 mg Oral Given 10/27/18 1151)    Initial Impression / Assessment and Plan / UC Course  I  have reviewed the triage vital signs and the nursing notes.  Pertinent labs & imaging results that were available during my care of the patient were reviewed by me and considered in my medical decision making (see chart for details).    Concern for possible acute appendicitis. [facility]  Final Clinical Impressions(s) / UC Diagnoses   Final diagnoses:  Right lower quadrant abdominal pain  Non-intractable vomiting with nausea, unspecified vomiting type   Discharge Instructions   None    ED Prescriptions    None         Lattie HawBeese, Mayce Noyes A, MD 10/27/18 1326

## 2018-10-27 NOTE — ED Triage Notes (Signed)
Pt c/o stomach pain x 2-3 days. Says everytime she tries to eat or drink anything, she gets nauseous and vomits. Feels a hard knot in her upper adb area. Has lost 10 lbs in a week. Tried tums with no relief.

## 2018-10-27 NOTE — Discharge Instructions (Signed)
Please see the information and instructions below regarding your visit.  Your diagnoses today include:  1. Epigastric pain   2. RUQ pain    Your examination suggests either inflammation in the stomach either gastritis versus peptic ulcer disease versus a problem with function of the gallbladder.  Your gallbladder was structurally normal on the CAT scan today.  Tests performed today include: See side panel of your discharge paperwork for testing performed today. Vital signs are listed at the bottom of these instructions.   Medications prescribed:    Take any prescribed medications only as prescribed, and any over the counter medications only as directed on the packaging.  Please start taking omeprazole once daily in the morning.  Please start taking a medicine called Carafate or sucralfate.  This can be taken up to 4 times a day, with meals and before bedtime.  Please do not take your proton pump inhibitor (Protonix, Nexium, Prilosec) within 30 minutes of taking Carafate.  You may take Zofran under the tongue every 8 hours as needed for nausea and vomiting.  Please take Pepcid twice a day for the first 1 to 2 weeks until the omeprazole is working.  I recommend stopping taking ibuprofen as this can irritate the stomach.  You may use Tylenol instead.  Home care instructions:  Please follow any educational materials contained in this packet.   Follow-up instructions: Please follow-up with your primary care provider as soon as possible for further evaluation of your symptoms if they are not completely improved. I recommend checking to see if you can be added to your Mom's insurance to find a PCP.   Return instructions:  Please return to the Emergency Department if you experience worsening symptoms.  Please return to the emergency department if you have any worsening of your pain, nausea or vomiting that prevents you keep anything down, constant pain, or pain that becomes more focal in one  area. Please return if you have any other emergent concerns.  Additional Information:   Your vital signs today were: BP 116/83 (BP Location: Left Arm)    Pulse 76    Temp 98.8 F (37.1 C) (Oral)    Resp 20    Ht 5\' 1"  (1.549 m)    Wt 77.1 kg    LMP 10/14/2018    SpO2 99%    BMI 32.12 kg/m  If your blood pressure (BP) was elevated on multiple readings during this visit above 130 for the top number or above 80 for the bottom number, please have this repeated by your primary care provider within one month. --------------  Thank you for allowing [place] to participate in your care today.

## 2018-10-27 NOTE — ED Notes (Signed)
ED Provider at bedside. 

## 2018-10-27 NOTE — ED Triage Notes (Signed)
Pt c/o R side abd pain x 3-4 days. Pt reports vomiting x 1 week; 4 times in the last 24 hours. Pt sent from UC to r/o appendicitis. Denies fever.

## 2018-10-27 NOTE — ED Notes (Signed)
Pt given Rx x 4 for carafate, zofran, prilosec, and famotidine

## 2018-10-27 NOTE — ED Notes (Addendum)
Hard stick. CBC not done in house. Sent to [facility] for further eval of possible appendicitis.

## 2018-10-27 NOTE — ED Notes (Signed)
Patient transported to CT 

## 2018-10-27 NOTE — ED Notes (Signed)
PT tolerated po water with tylenol

## 2019-05-31 ENCOUNTER — Other Ambulatory Visit: Payer: Self-pay

## 2019-05-31 ENCOUNTER — Emergency Department
Admission: EM | Admit: 2019-05-31 | Discharge: 2019-05-31 | Disposition: A | Discharge status: Home / Self Care | Payer: Medicaid Other | Source: Home / Self Care

## 2019-05-31 DIAGNOSIS — J45901 Unspecified asthma with (acute) exacerbation: Secondary | ICD-10-CM

## 2019-05-31 MED ORDER — ALBUTEROL SULFATE (2.5 MG/3ML) 0.083% IN NEBU: 2.5000 mg | INHALATION_SOLUTION | Freq: Four times a day (QID) | RESPIRATORY_TRACT | 12 refills | Status: AC | PRN

## 2019-05-31 MED ORDER — FLUTICASONE PROPIONATE HFA 110 MCG/ACT IN AERO: 2.0000 | INHALATION_SPRAY | Freq: Two times a day (BID) | RESPIRATORY_TRACT | 2 refills | Status: AC

## 2019-05-31 MED ORDER — ALBUTEROL SULFATE HFA 108 (90 BASE) MCG/ACT IN AERS: 2.0000 | INHALATION_SPRAY | Freq: Four times a day (QID) | RESPIRATORY_TRACT | 2 refills | Status: AC | PRN

## 2019-05-31 MED ORDER — ALBUTEROL SULFATE (2.5 MG/3ML) 0.083% IN NEBU: 2.5000 mg | INHALATION_SOLUTION | Freq: Four times a day (QID) | RESPIRATORY_TRACT | 2 refills | Status: AC | PRN

## 2019-05-31 MED ORDER — PREDNISONE 10 MG PO TABS: ORAL_TABLET | ORAL | 0 refills | Status: AC

## 2019-05-31 NOTE — ED Triage Notes (Signed)
Pt c/o worsening asthma and a cough mostly at night that's keeping her awake at night. Cough for 3 days. Denies any other sxs. No OTC meds tried.

## 2019-06-01 NOTE — ED Provider Notes (Signed)
[facility]    CSN: [id number] Arrival date & time: 05/31/19  1823      History   Chief Complaint Chief Complaint  Patient presents with  . Asthma  . Cough    HPI Joann Dickerson is a 19 y.o. female.   The history is provided by the patient. No language interpreter was used.  Asthma This is a new problem. The current episode started 12 to 24 hours ago. The problem occurs constantly. The problem has not changed since onset.Nothing aggravates the symptoms. Nothing relieves the symptoms. She has tried nothing for the symptoms. The treatment provided no relief.  Cough  Pt reports she is out of her asthma medications.  Pt reports she has been having frequent asthma attacks at night  Past Medical History:  Diagnosis Date  . Asthma     There are no problems to display for this patient.   History reviewed. No pertinent surgical history.  OB History   No obstetric history on file.      Home Medications    Prior to Admission medications   Medication Sig Start Date End Date Taking? Authorizing Provider  albuterol (PROVENTIL) (2.5 MG/3ML) 0.083% nebulizer solution Take 3 mLs (2.5 mg total) by nebulization every 6 (six) hours as needed for wheezing or shortness of breath. 05/31/19   Fransico Meadow, PA-C  albuterol (PROVENTIL) (2.5 MG/3ML) 0.083% nebulizer solution Take 3 mLs (2.5 mg total) by nebulization every 6 (six) hours as needed for wheezing or shortness of breath. 05/31/19   Fransico Meadow, PA-C  albuterol (VENTOLIN HFA) 108 (90 Base) MCG/ACT inhaler Inhale 2 puffs into the lungs every 6 (six) hours as needed for wheezing or shortness of breath. 05/31/19   Fransico Meadow, PA-C  amoxicillin-clavulanate (AUGMENTIN) 875-125 MG tablet Take 1 tablet by mouth every 12 (twelve) hours. 10/14/18   Bast, Tressia Miners A, NP  benzonatate (TESSALON) 100 MG capsule Take 1 capsule (100 mg total) by mouth every 8 (eight) hours. 10/14/18   Bast, Tressia Miners A, NP    cetirizine-pseudoephedrine (ZYRTEC-D) 5-120 MG tablet Take 1 tablet by mouth daily. 10/14/18   Loura Halt A, NP  famotidine (PEPCID) 20 MG tablet Take 1 tablet (20 mg total) by mouth 2 (two) times daily. 10/27/18   Langston Masker B, PA-C  fluticasone (FLONASE) 50 MCG/ACT nasal spray Place 2 sprays into both nostrils daily. 10/14/18   Bast, Tressia Miners A, NP  fluticasone (FLOVENT HFA) 110 MCG/ACT inhaler Inhale 2 puffs into the lungs 2 (two) times daily. 05/31/19 05/30/20  Fransico Meadow, PA-C  omeprazole (PRILOSEC) 20 MG capsule Take 1 capsule (20 mg total) by mouth daily for 30 days. 10/27/18 11/26/18  Langston Masker B, PA-C  ondansetron (ZOFRAN ODT) 4 MG disintegrating tablet Take 1 tablet (4 mg total) by mouth every 8 (eight) hours as needed for nausea or vomiting. 10/27/18   Langston Masker B, PA-C  predniSONE (DELTASONE) 10 MG tablet 6,5,4,3,2,1 taper 05/31/19   Caryl Ada K, PA-C  sucralfate (CARAFATE) 1 g tablet Take 1 tablet (1 g total) by mouth 4 (four) times daily -  with meals and at bedtime for 10 days. 10/27/18 11/06/18  Albesa Seen, PA-C    Family History History reviewed. No pertinent family history.  Social History Social History   Tobacco Use  . Smoking status: Never Smoker  . Smokeless tobacco: Never Used  Substance Use Topics  . Alcohol use: No  . Drug use: Never     Allergies  Zithromax [azithromycin]   Review of Systems Review of Systems  Respiratory: Positive for cough.   All other systems reviewed and are negative.    Physical Exam Triage Vital Signs ED Triage Vitals  Enc Vitals Group     BP 05/31/19 1835 119/82     Pulse Rate 05/31/19 1835 81     Resp 05/31/19 1835 18     Temp 05/31/19 1835 98.2 F (36.8 C)     Temp Source 05/31/19 1835 Oral     SpO2 05/31/19 1835 100 %     Weight 05/31/19 1836 175 lb (79.4 kg)     Height 05/31/19 1836 5\' 1"  (1.549 m)     Head Circumference --      Peak Flow --      Pain Score 05/31/19 1836 0     Pain Loc --       Pain Edu? --      Excl. in [place]? --    No data found.  Updated Vital Signs BP 119/82 (BP Location: Right Arm)   Pulse 81   Temp 98.2 F (36.8 C) (Oral)   Resp 18   Ht 5\' 1"  (1.549 m)   Wt 79.4 kg   LMP 05/16/2019 (Approximate)   SpO2 100%   BMI 33.07 kg/m   Visual Acuity Right Eye Distance:   Left Eye Distance:   Bilateral Distance:    Right Eye Near:   Left Eye Near:    Bilateral Near:     Physical Exam Vitals and nursing note reviewed.  Constitutional:      Appearance: She is well-developed.  HENT:     Head: Normocephalic.  Cardiovascular:     Rate and Rhythm: Normal rate.  Pulmonary:     Effort: Pulmonary effort is normal.  Abdominal:     General: There is no distension.  Musculoskeletal:        General: Normal range of motion.     Cervical back: Normal range of motion.  Skin:    General: Skin is warm.  Neurological:     Mental Status: She is alert and oriented to person, place, and time.  Psychiatric:        Mood and Affect: Mood normal.      UC Treatments / Results  Labs (all labs ordered are listed, but only abnormal results are displayed) Labs Reviewed - No data to display  EKG   Radiology No results found.  Procedures Procedures (including critical care time)  Medications Ordered in UC Medications - No data to display  Initial Impression / Assessment and Plan / UC Course  I have reviewed the triage vital signs and the nursing notes.  Pertinent labs & imaging results that were available during my care of the patient were reviewed by me and considered in my medical decision making (see chart for details).     MDM  No wheezing. Pt reports she just used her inhaler.  Pt given rx for prednisone, albuterol and flovent.   Final Clinical Impressions(s) / UC Diagnoses   Final diagnoses:  Moderate asthma with exacerbation, unspecified whether persistent   Discharge Instructions   None    ED Prescriptions    Medication Sig Dispense  Auth. Provider   albuterol (PROVENTIL) (2.5 MG/3ML) 0.083% nebulizer solution Take 3 mLs (2.5 mg total) by nebulization every 6 (six) hours as needed for wheezing or shortness of breath. 75 mL Nirav Sweda K, PA-C   albuterol (PROVENTIL) (2.5 MG/3ML) 0.083% nebulizer solution Take  3 mLs (2.5 mg total) by nebulization every 6 (six) hours as needed for wheezing or shortness of breath. 75 mL Katrinia Straker K, PA-C   albuterol (VENTOLIN HFA) 108 (90 Base) MCG/ACT inhaler Inhale 2 puffs into the lungs every 6 (six) hours as needed for wheezing or shortness of breath. 1 g Yukari Flax K, PA-C   predniSONE (DELTASONE) 10 MG tablet 6,5,4,3,2,1 taper 21 tablet Stacey Maura K, PA-C   fluticasone (FLOVENT HFA) 110 MCG/ACT inhaler Inhale 2 puffs into the lungs 2 (two) times daily. 1 Inhaler Elson Areas, [place]     PDMP not reviewed this encounter.  An After Visit Summary was printed and given to the patient.    Elson Areas, [place] 06/01/19 [phone]

## 2020-09-04 ENCOUNTER — Encounter: Payer: Self-pay | Admitting: Neurology

## 2020-10-15 ENCOUNTER — Encounter (HOSPITAL_BASED_OUTPATIENT_CLINIC_OR_DEPARTMENT_OTHER): Payer: Self-pay | Admitting: Emergency Medicine

## 2020-10-15 ENCOUNTER — Emergency Department (HOSPITAL_BASED_OUTPATIENT_CLINIC_OR_DEPARTMENT_OTHER)
Admission: EM | Admit: 2020-10-15 | Discharge: 2020-10-15 | Disposition: A | Discharge status: Home / Self Care | Payer: BLUE CROSS/BLUE SHIELD | Attending: Emergency Medicine | Admitting: Emergency Medicine

## 2020-10-15 ENCOUNTER — Other Ambulatory Visit: Payer: Self-pay

## 2020-10-15 DIAGNOSIS — N938 Other specified abnormal uterine and vaginal bleeding: Secondary | ICD-10-CM | POA: Insufficient documentation

## 2020-10-15 DIAGNOSIS — J45909 Unspecified asthma, uncomplicated: Secondary | ICD-10-CM | POA: Insufficient documentation

## 2020-10-15 LAB — CBC
HCT: 40.7 % (ref 36.0–46.0)
Hemoglobin: 13.5 g/dL (ref 12.0–15.0)
MCH: 29.5 pg (ref 26.0–34.0)
MCHC: 33.2 g/dL (ref 30.0–36.0)
MCV: 89.1 fL (ref 80.0–100.0)
Platelets: 305 10*3/uL (ref 150–400)
RBC: 4.57 MIL/uL (ref 3.87–5.11)
RDW: 13 % (ref 11.5–15.5)
WBC: 8.5 10*3/uL (ref 4.0–10.5)
nRBC: 0 % (ref 0.0–0.2)

## 2020-10-15 LAB — WET PREP, GENITAL
Clue Cells Wet Prep HPF POC: NONE SEEN
Sperm: NONE SEEN
Trich, Wet Prep: NONE SEEN
Yeast Wet Prep HPF POC: NONE SEEN

## 2020-10-15 LAB — PREGNANCY, URINE: Preg Test, Ur: NEGATIVE

## 2020-10-15 NOTE — ED Notes (Signed)
Pelvic cart at bedside. 

## 2020-10-15 NOTE — Discharge Instructions (Addendum)
Take 4 over the counter ibuprofen tablets 3 times a day or 2 over-the-counter naproxen tablets twice a day for pain. Also take tylenol 1000mg (2 extra strength) four times a day.  Please return for worsening bleeding if you feel you are going to pass out or do pass out.  Please follow-up with your OB/GYN in the office.

## 2020-10-15 NOTE — ED Notes (Signed)
Pt took Ibuprofen 800mg  at 230pm today - no relief per pt.

## 2020-10-15 NOTE — ED Triage Notes (Signed)
Pt reports heavier than normal bleeding with large clots and mild dizziness for past 2 days.  Pt reports bleeding thru 2 super tampons in 20-45 minutes and that she was advised by her OB to come to ED for eval.

## 2020-10-15 NOTE — ED Provider Notes (Signed)
[facility] EMERGENCY DEPT Provider Note   CSN: [id number] Arrival date & time: 10/15/20  1741     History Chief Complaint  Patient presents with   Vaginal Bleeding    Joann Dickerson is a 20 y.o. female.  20yo F with a chief complaints of profuse vaginal bleeding.  Going on for the past couple days.  She called her OB/GYN who suggested she come to the ED for evaluation.  Denies likelihood of being pregnant.  Having some mild pelvic cramping with this.  Denies discharge.  Denies urinary symptoms.  Has a remote history of an ovarian cyst.  The history is provided by the patient.  Vaginal Bleeding Associated symptoms: no dizziness, no dysuria, no fever and no nausea   Illness Severity:  Moderate Onset quality:  Gradual Duration:  2 days Timing:  Constant Progression:  Worsening Chronicity:  New Associated symptoms: no chest pain, no congestion, no fever, no headaches, no myalgias, no nausea, no rhinorrhea, no shortness of breath, no vomiting and no wheezing       Past Medical History:  Diagnosis Date   Asthma     There are no problems to display for this patient.   No past surgical history on file.   OB History   No obstetric history on file.     No family history on file.  Social History   Tobacco Use   Smoking status: Never   Smokeless tobacco: Never  Vaping Use   Vaping Use: Every day  Substance Use Topics   Alcohol use: No   Drug use: Never    Home Medications Prior to Admission medications   Medication Sig Start Date End Date Taking? Authorizing Provider  albuterol (PROVENTIL) (2.5 MG/3ML) 0.083% nebulizer solution Take 3 mLs (2.5 mg total) by nebulization every 6 (six) hours as needed for wheezing or shortness of breath. 05/31/19   Elson Areas, PA-C  albuterol (PROVENTIL) (2.5 MG/3ML) 0.083% nebulizer solution Take 3 mLs (2.5 mg total) by nebulization every 6 (six) hours as needed for wheezing or shortness of breath. 05/31/19    Elson Areas, PA-C  albuterol (VENTOLIN HFA) 108 (90 Base) MCG/ACT inhaler Inhale 2 puffs into the lungs every 6 (six) hours as needed for wheezing or shortness of breath. 05/31/19   Elson Areas, PA-C  amoxicillin-clavulanate (AUGMENTIN) 875-125 MG tablet Take 1 tablet by mouth every 12 (twelve) hours. 10/14/18   Bast, Gloris Manchester A, NP  benzonatate (TESSALON) 100 MG capsule Take 1 capsule (100 mg total) by mouth every 8 (eight) hours. 10/14/18   Bast, Gloris Manchester A, NP  cetirizine-pseudoephedrine (ZYRTEC-D) 5-120 MG tablet Take 1 tablet by mouth daily. 10/14/18   Dahlia Byes A, NP  famotidine (PEPCID) 20 MG tablet Take 1 tablet (20 mg total) by mouth 2 (two) times daily. 10/27/18   Aviva Kluver B, PA-C  fluticasone (FLONASE) 50 MCG/ACT nasal spray Place 2 sprays into both nostrils daily. 10/14/18   Bast, Gloris Manchester A, NP  fluticasone (FLOVENT HFA) 110 MCG/ACT inhaler Inhale 2 puffs into the lungs 2 (two) times daily. 05/31/19 05/30/20  Elson Areas, PA-C  omeprazole (PRILOSEC) 20 MG capsule Take 1 capsule (20 mg total) by mouth daily for 30 days. 10/27/18 11/26/18  Aviva Kluver B, PA-C  ondansetron (ZOFRAN ODT) 4 MG disintegrating tablet Take 1 tablet (4 mg total) by mouth every 8 (eight) hours as needed for nausea or vomiting. 10/27/18   Aviva Kluver B, PA-C  predniSONE (DELTASONE) 10 MG tablet 6,5,4,3,2,1 taper  05/31/19   Elson Areas, PA-C  sucralfate (CARAFATE) 1 g tablet Take 1 tablet (1 g total) by mouth 4 (four) times daily -  with meals and at bedtime for 10 days. 10/27/18 11/06/18  Aviva Kluver B, PA-C    Allergies    Zithromax [azithromycin]  Review of Systems   Review of Systems  Constitutional:  Negative for chills and fever.  HENT:  Negative for congestion and rhinorrhea.   Eyes:  Negative for redness and visual disturbance.  Respiratory:  Negative for shortness of breath and wheezing.   Cardiovascular:  Negative for chest pain and palpitations.  Gastrointestinal:  Negative for nausea and  vomiting.  Genitourinary:  Positive for pelvic pain and vaginal bleeding. Negative for dysuria and urgency.  Musculoskeletal:  Negative for arthralgias and myalgias.  Skin:  Negative for pallor and wound.  Neurological:  Negative for dizziness and headaches.   Physical Exam Updated Vital Signs BP 121/70   Pulse 89   Temp 98 F (36.7 C) (Oral)   Resp 16   Ht 5\' 1"  (1.549 m)   Wt 79.4 kg   LMP 10/12/2020 (Exact Date)   SpO2 100%   BMI 33.07 kg/m   Physical Exam Vitals and nursing note reviewed.  Constitutional:      General: She is not in acute distress.    Appearance: She is well-developed. She is not diaphoretic.  HENT:     Head: Normocephalic and atraumatic.  Eyes:     Pupils: Pupils are equal, round, and reactive to light.  Cardiovascular:     Rate and Rhythm: Normal rate and regular rhythm.     Heart sounds: No murmur heard.   No friction rub. No gallop.  Pulmonary:     Effort: Pulmonary effort is normal.     Breath sounds: No wheezing or rales.  Abdominal:     General: There is no distension.     Palpations: Abdomen is soft.     Tenderness: There is no abdominal tenderness.  Genitourinary:    Comments: Trace blood at the cervix.  No cervical motion tenderness.  Mild diffuse pain about the uterus without unilaterality Musculoskeletal:        General: No tenderness.     Cervical back: Normal range of motion and neck supple.  Skin:    General: Skin is warm and dry.  Neurological:     Mental Status: She is alert and oriented to person, place, and time.  Psychiatric:        Behavior: Behavior normal.    ED Results / Procedures / Treatments   Labs (all labs ordered are listed, but only abnormal results are displayed) Labs Reviewed  WET PREP, GENITAL - Abnormal; Notable for the following components:      Result Value   WBC, Wet Prep HPF POC FEW (*)    All other components within normal limits  CBC  PREGNANCY, URINE  GC/CHLAMYDIA PROBE AMP ([place]) NOT  AT [facility]    EKG None  Radiology No results found.  Procedures Procedures   Medications Ordered in ED Medications - No data to display  ED Course  I have reviewed the triage vital signs and the nursing notes.  Pertinent labs & imaging results that were available during my care of the patient were reviewed by me and considered in my medical decision making (see chart for details).    MDM Rules/Calculators/A&P  20 yo F with a chief complaint of vaginal bleeding.  Going on for a couple days.  More than her normal menstrual cycle.  Pregnancy test here is negative.  Hemoglobin without anemia.  My exam without significant bleeding.  We will have her take Tylenol and NSAIDs.  OB/GYN follow-up.  7:08 PM:  I have discussed the diagnosis/risks/treatment options with the patient and family and believe the pt to be eligible for discharge home to follow-up with GYN. We also discussed returning to the ED immediately if new or worsening sx occur. We discussed the sx which are most concerning (e.g., sudden worsening pain, fever, inability to tolerate by mouth, syncope, worsening bleeding) that necessitate immediate return. Medications administered to the patient during their visit and any new prescriptions provided to the patient are listed below.  Medications given during this visit Medications - No data to display   The patient appears reasonably screen and/or stabilized for discharge and I doubt any other medical condition or other [facility] requiring further screening, evaluation, or treatment in the ED at this time prior to discharge.   Final Clinical Impression(s) / ED Diagnoses Final diagnoses:  DUB (dysfunctional uterine bleeding)    Rx / DC Orders ED Discharge Orders     None        Melene Plan, DO 10/15/20 1908

## 2020-10-18 LAB — GC/CHLAMYDIA PROBE AMP (~~LOC~~) NOT AT ARMC
Chlamydia: POSITIVE — AB
Comment: NEGATIVE
Comment: NORMAL
Neisseria Gonorrhea: NEGATIVE

## 2020-11-12 NOTE — Progress Notes (Deleted)
NEUROLOGY CONSULTATION NOTE  [name] MRN: [id number] DOB: May 22, 2000  Referring provider: Garth Bigness, MD Primary care provider: Garth Bigness, MD  Reason for consult:  migraines  Assessment/Plan:   ***   Subjective:  Joann Dickerson is a 20 year old ***-handed female with asthma who presents for migraines.  History supplemented by referring provider's note.  ***  Current NSAIDS/analgesics:  ibuprofen Current triptans:  sumatriptan 25mg  Current ergotamine:  *** Current anti-emetic:  *** Current muscle relaxants:  *** Current Antihypertensive medications:  *** Current Antidepressant medications:  *** Current Anticonvulsant medications:  *** Current anti-CGRP:  *** Current Vitamins/Herbal/Supplements:  *** Current Antihistamines/Decongestants:  *** Other therapy:  *** Hormone/birth control:  *** Other medications:  ***  Past NSAIDS/analgesics:  *** Past abortive triptans:  *** Past abortive ergotamine:  *** Past muscle relaxants:  *** Past anti-emetic:  *** Past antihypertensive medications:  *** Past antidepressant medications:  *** Past anticonvulsant medications:  *** Past anti-CGRP:  *** Past vitamins/Herbal/Supplements:  *** Past antihistamines/decongestants:  *** Other past therapies:  ***  Caffeine:  *** Alcohol:  *** Smoker:  *** Diet:  *** Exercise:  *** Depression:  ***; Anxiety:  *** Other pain:  *** Sleep hygiene:  *** Family history of headache:  ***     PAST MEDICAL HISTORY: Past Medical History:  Diagnosis Date   Asthma     PAST SURGICAL HISTORY: No past surgical history on file.  MEDICATIONS: Current Outpatient Medications on File Prior to Visit  Medication Sig Dispense Refill   albuterol (PROVENTIL) (2.5 MG/3ML) 0.083% nebulizer solution Take 3 mLs (2.5 mg total) by nebulization every 6 (six) hours as needed for wheezing or shortness of breath. 75 mL 12   albuterol (PROVENTIL) (2.5 MG/3ML) 0.083%  nebulizer solution Take 3 mLs (2.5 mg total) by nebulization every 6 (six) hours as needed for wheezing or shortness of breath. 75 mL 2   albuterol (VENTOLIN HFA) 108 (90 Base) MCG/ACT inhaler Inhale 2 puffs into the lungs every 6 (six) hours as needed for wheezing or shortness of breath. 1 g 2   amoxicillin-clavulanate (AUGMENTIN) 875-125 MG tablet Take 1 tablet by mouth every 12 (twelve) hours. 14 tablet 0   benzonatate (TESSALON) 100 MG capsule Take 1 capsule (100 mg total) by mouth every 8 (eight) hours. 21 capsule 0   cetirizine-pseudoephedrine (ZYRTEC-D) 5-120 MG tablet Take 1 tablet by mouth daily. 30 tablet 0   famotidine (PEPCID) 20 MG tablet Take 1 tablet (20 mg total) by mouth 2 (two) times daily. 30 tablet 0   fluticasone (FLONASE) 50 MCG/ACT nasal spray Place 2 sprays into both nostrils daily. 16 g 0   fluticasone (FLOVENT HFA) 110 MCG/ACT inhaler Inhale 2 puffs into the lungs 2 (two) times daily. 1 Inhaler 2   omeprazole (PRILOSEC) 20 MG capsule Take 1 capsule (20 mg total) by mouth daily for 30 days. 30 capsule 0   ondansetron (ZOFRAN ODT) 4 MG disintegrating tablet Take 1 tablet (4 mg total) by mouth every 8 (eight) hours as needed for nausea or vomiting. 12 tablet 0   predniSONE (DELTASONE) 10 MG tablet 6,5,4,3,2,1 taper 21 tablet 0   sucralfate (CARAFATE) 1 g tablet Take 1 tablet (1 g total) by mouth 4 (four) times daily -  with meals and at bedtime for 10 days. 40 tablet 0   No current facility-administered medications on file prior to visit.    ALLERGIES: Allergies  Allergen Reactions   Zithromax [Azithromycin]     Rash,  facial swelling    FAMILY HISTORY: No family history on file.  Objective:  *** General: No acute distress.  Patient appears well-groomed.   Head:  Normocephalic/atraumatic Eyes:  fundi examined but not visualized Neck: supple, no paraspinal tenderness, full range of motion Back: No paraspinal tenderness Heart: regular rate and rhythm Lungs: Clear  to auscultation bilaterally. Vascular: No carotid bruits. Neurological Exam: Mental status: alert and oriented to person, place, and time, recent and remote memory intact, fund of knowledge intact, attention and concentration intact, speech fluent and not dysarthric, language intact. Cranial nerves: CN I: not tested CN II: pupils equal, round and reactive to light, visual fields intact CN III, IV, VI:  full range of motion, no nystagmus, no ptosis CN V: facial sensation intact. CN VII: upper and lower face symmetric CN VIII: hearing intact CN IX, X: gag intact, uvula midline CN XI: sternocleidomastoid and trapezius muscles intact CN XII: tongue midline Bulk & Tone: normal, no fasciculations. Motor:  muscle strength 5/5 throughout Sensation:  Pinprick, temperature and vibratory sensation intact. Deep Tendon Reflexes:  2+ throughout,  toes downgoing.   Finger to nose testing:  Without dysmetria.   Heel to shin:  Without dysmetria.   Gait:  Normal station and stride.  Romberg negative.    Thank you for allowing me to take part in the care of this patient.  Shon Millet, DO  CC: ***

## 2020-11-16 ENCOUNTER — Ambulatory Visit: Payer: BLUE CROSS/BLUE SHIELD | Admitting: Neurology

## 2020-11-27 IMAGING — CT CT ABDOMEN AND PELVIS WITH CONTRAST
2 of 4 series · 16 of 46 positions shown, 18 images · IV contrast (omnipaque)
Comparison: None.

CLINICAL DATA: Abdominal pain

EXAM:
CT ABDOMEN AND PELVIS WITH CONTRAST
TECHNIQUE: Multidetector CT imaging of the abdomen and pelvis was performed
using the standard protocol following bolus administration of
intravenous contrast.
CONTRAST:  100mL OMNIPAQUE IOHEXOL 300 MG/ML  SOLN

[Series 2: axial st · axial · 0.66mm/px · z∈[-486,-72]mm · 13 of 91 slices shown, 15 images]
[im 4/91  soft-tissue]
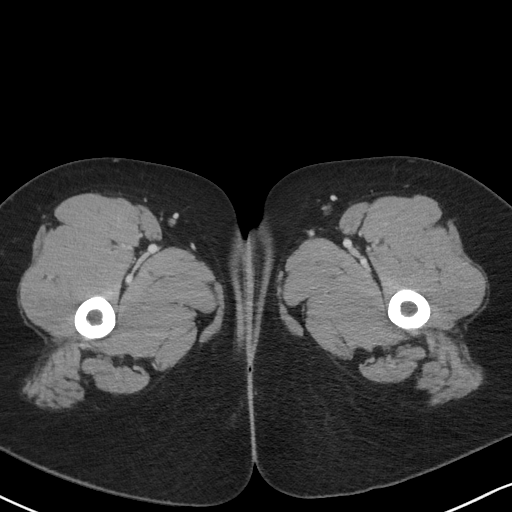
[im 4/91  bone]
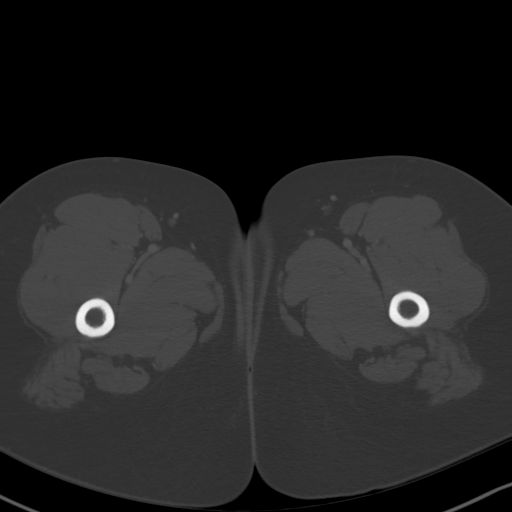
[im 11/91  soft-tissue]
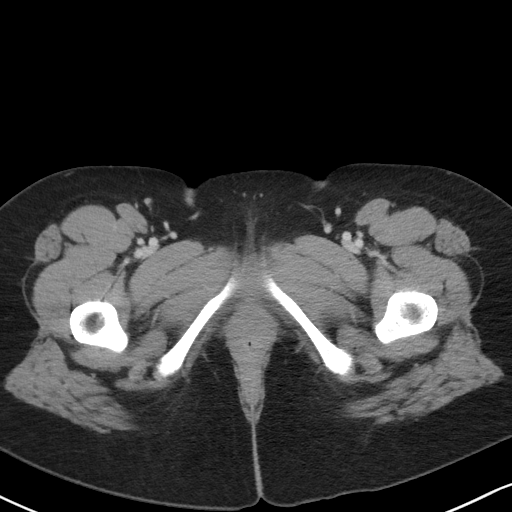
[im 19/91  soft-tissue]
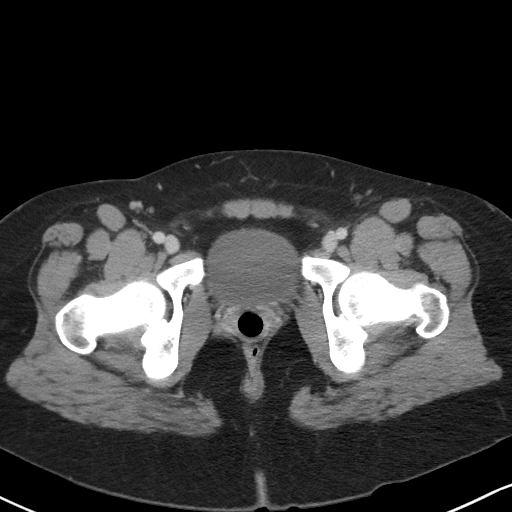
[im 26/91  soft-tissue]
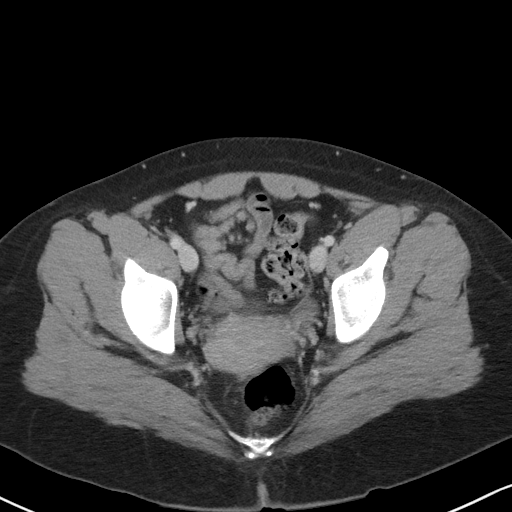
[im 33/91  soft-tissue]
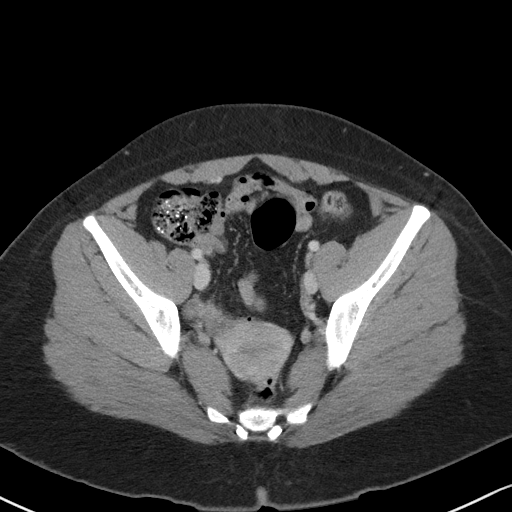
[im 40/91  soft-tissue]
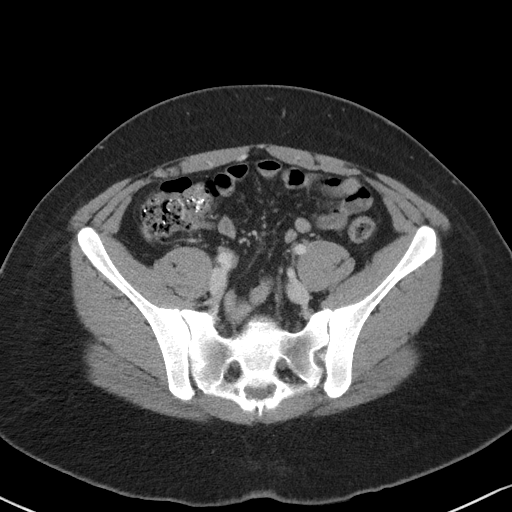
[im 47/91  soft-tissue]
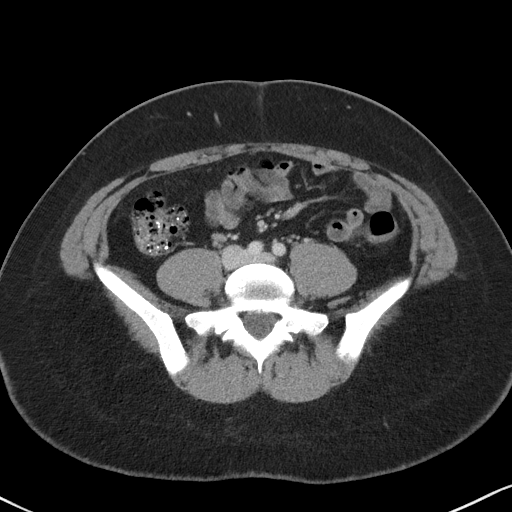
[im 51/91  soft-tissue]
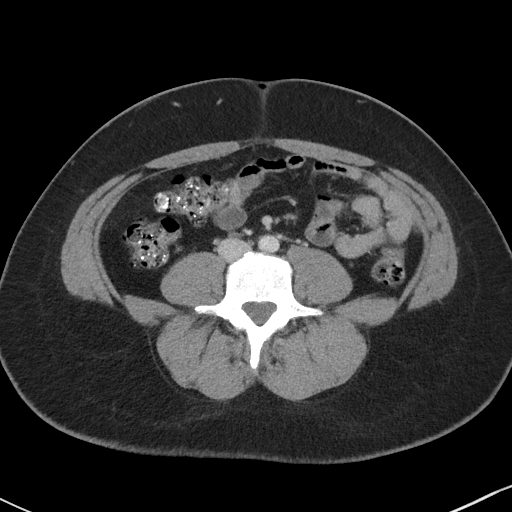
[im 58/91  soft-tissue]
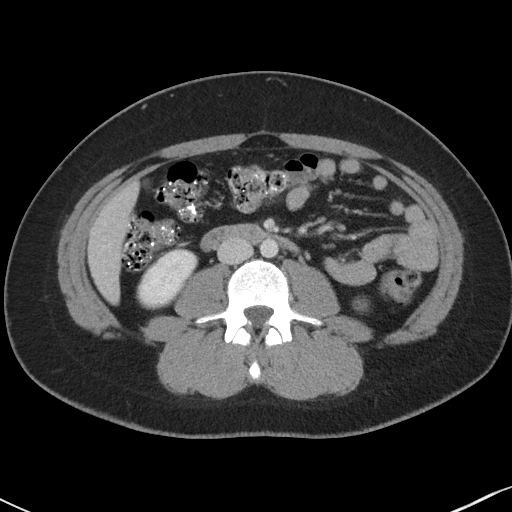
[im 58/91  bone]
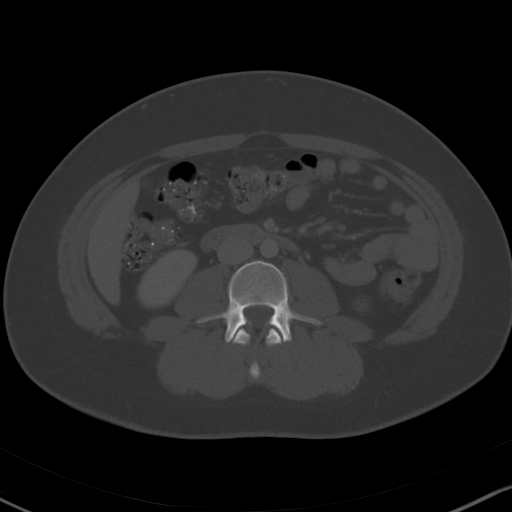
[im 65/91  soft-tissue]
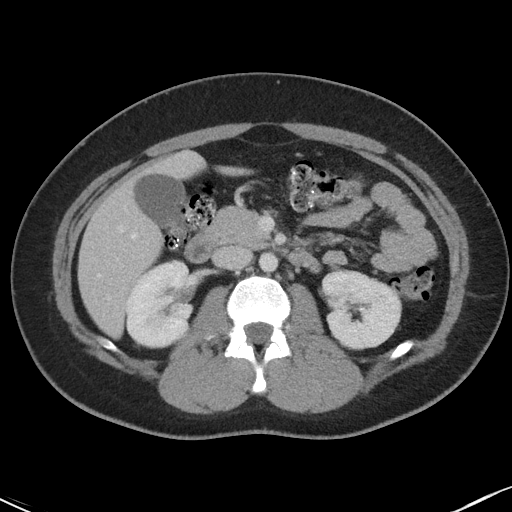
[im 73/91  soft-tissue]
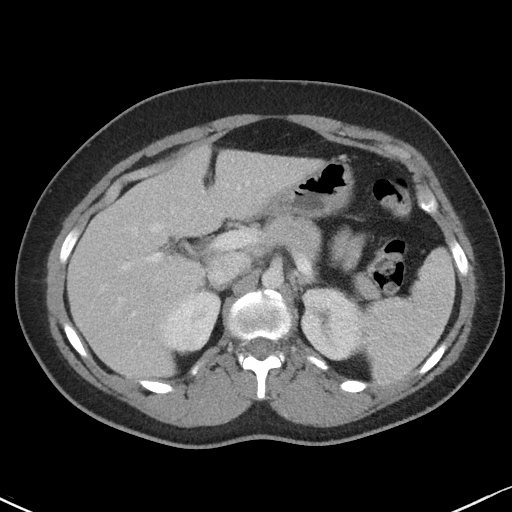
[im 80/91  soft-tissue]
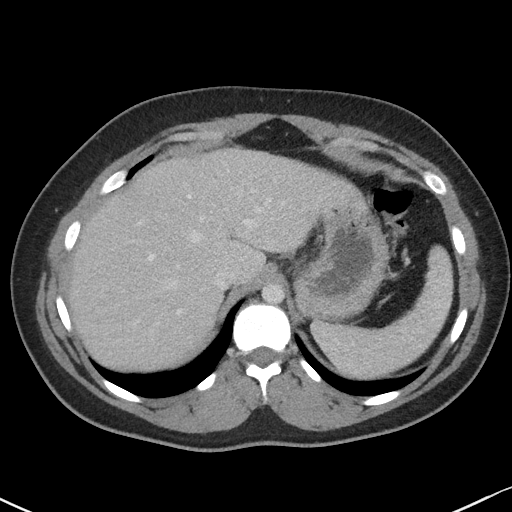
[im 87/91  soft-tissue]
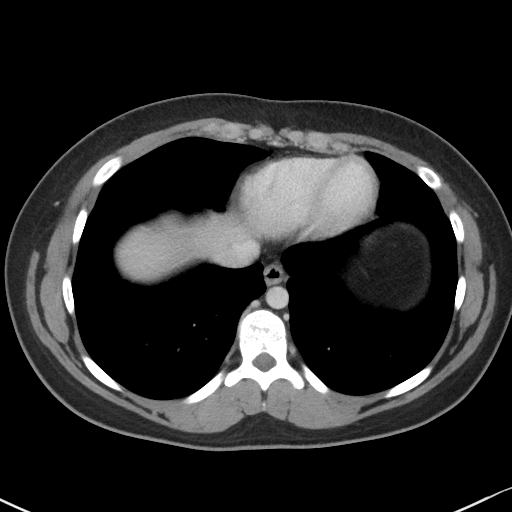

[Series 5: coronal st · coronal · 0.83mm/px · 3 of 82 slices shown]
[im 28/82  soft-tissue]
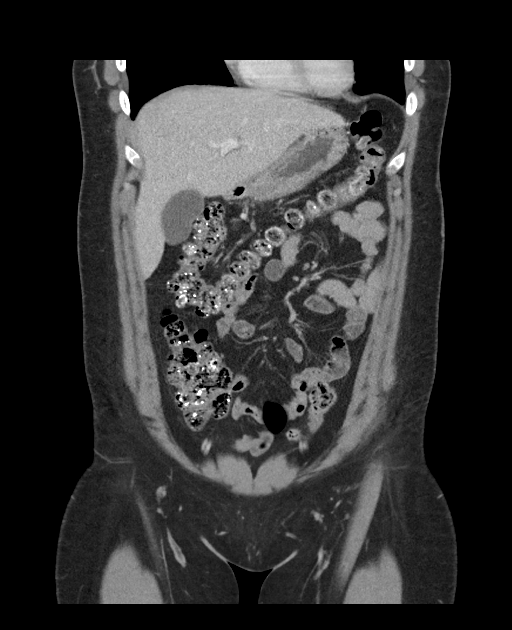
[im 37/82  soft-tissue]
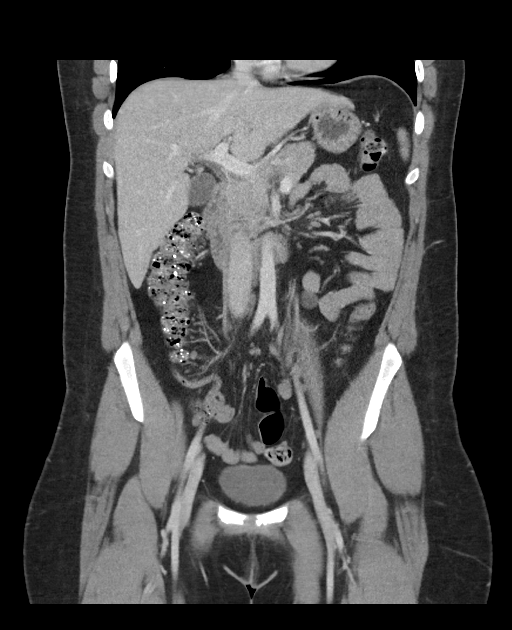
[im 46/82  soft-tissue]
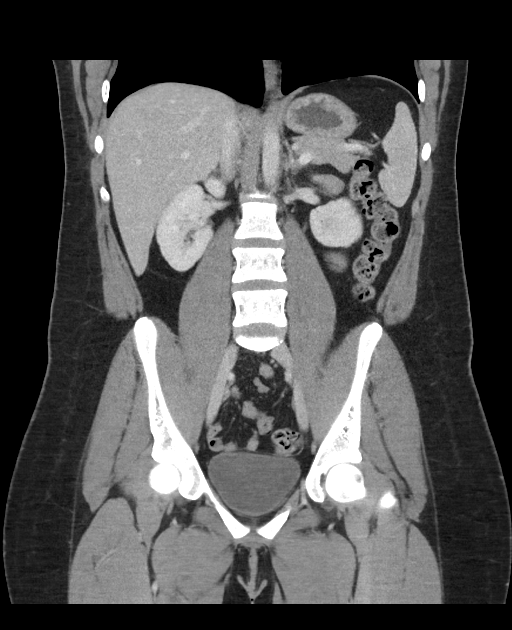

[16 of 46 positions shown; findings below may reference images not displayed]

FINDINGS: Lower chest: No acute abnormality.

Hepatobiliary: No focal liver abnormality is seen. No gallstones,
gallbladder wall thickening, or biliary dilatation.

Pancreas: Unremarkable. No pancreatic ductal dilatation or
surrounding inflammatory changes.

Spleen: Normal in size without focal abnormality.

Adrenals/Urinary Tract: Adrenal glands are unremarkable. Kidneys are
normal, without renal calculi, focal lesion, or hydronephrosis.
Bladder is unremarkable.

Stomach/Bowel: Stomach is within normal limits. Appendix appears
normal. No evidence of bowel wall thickening, distention, or
inflammatory changes. Punctate radiopaque material in the colon.

Vascular/Lymphatic: No significant vascular findings are present. No
enlarged abdominal or pelvic lymph nodes.

Reproductive: Tampon in the vagina. Uterus otherwise unremarkable.
No adnexal mass.

Other: No abdominal wall hernia or abnormality. No abdominopelvic
ascites.

Musculoskeletal: No acute or significant osseous findings.
IMPRESSION: Negative. No CT evidence for acute intra-abdominal or pelvic
abnormality
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
## 2021-02-03 ENCOUNTER — Ambulatory Visit: Payer: Self-pay | Admitting: Neurology
